# Patient Record
Sex: Male | Born: 2000 | Race: White | Hispanic: No | Marital: Single | Smoking: Never smoker
Health system: Southern US, Community
[De-identification: ages and names within clinical notes are randomized; demographics above are authoritative.]

## PROBLEM LIST (undated history)

## (undated) HISTORY — PX: TONSILLECTOMY: SUR1361

---
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
## 2021-01-01 DIAGNOSIS — Z419 Encounter for procedure for purposes other than remedying health state, unspecified: Secondary | ICD-10-CM | POA: Diagnosis not present

## 2021-01-21 DIAGNOSIS — S39012A Strain of muscle, fascia and tendon of lower back, initial encounter: Secondary | ICD-10-CM | POA: Diagnosis not present

## 2021-01-21 DIAGNOSIS — G44309 Post-traumatic headache, unspecified, not intractable: Secondary | ICD-10-CM | POA: Diagnosis not present

## 2021-01-21 DIAGNOSIS — S161XXA Strain of muscle, fascia and tendon at neck level, initial encounter: Secondary | ICD-10-CM | POA: Diagnosis not present

## 2021-01-29 DIAGNOSIS — Z419 Encounter for procedure for purposes other than remedying health state, unspecified: Secondary | ICD-10-CM | POA: Diagnosis not present

## 2021-02-18 ENCOUNTER — Emergency Department (HOSPITAL_COMMUNITY): Payer: Medicaid Other

## 2021-02-18 ENCOUNTER — Emergency Department: Payer: Medicaid Other

## 2021-02-18 ENCOUNTER — Other Ambulatory Visit: Payer: Self-pay

## 2021-02-18 ENCOUNTER — Emergency Department
Admission: EM | Admit: 2021-02-18 | Discharge: 2021-02-18 | Disposition: A | Discharge status: Home / Self Care | Payer: Medicaid Other | Attending: Emergency Medicine | Admitting: Emergency Medicine

## 2021-02-18 DIAGNOSIS — Y92410 Unspecified street and highway as the place of occurrence of the external cause: Secondary | ICD-10-CM | POA: Insufficient documentation

## 2021-02-18 DIAGNOSIS — S39012A Strain of muscle, fascia and tendon of lower back, initial encounter: Secondary | ICD-10-CM | POA: Insufficient documentation

## 2021-02-18 DIAGNOSIS — S161XXA Strain of muscle, fascia and tendon at neck level, initial encounter: Secondary | ICD-10-CM | POA: Diagnosis not present

## 2021-02-18 DIAGNOSIS — G44319 Acute post-traumatic headache, not intractable: Secondary | ICD-10-CM | POA: Diagnosis not present

## 2021-02-18 DIAGNOSIS — S199XXA Unspecified injury of neck, initial encounter: Secondary | ICD-10-CM | POA: Diagnosis not present

## 2021-02-18 DIAGNOSIS — R519 Headache, unspecified: Secondary | ICD-10-CM | POA: Diagnosis not present

## 2021-02-18 MED ORDER — MELOXICAM 15 MG PO TABS: 15.0000 mg | ORAL_TABLET | Freq: Every day | ORAL | 0 refills | Status: DC

## 2021-02-18 MED ORDER — MELOXICAM 7.5 MG PO TABS
15.0000 mg | ORAL_TABLET | Freq: Once | ORAL | Status: AC
  Administered 2021-02-18: 15 mg via ORAL
  Filled 2021-02-18: qty 2

## 2021-02-18 MED ORDER — METHOCARBAMOL 500 MG PO TABS: 500.0000 mg | ORAL_TABLET | Freq: Four times a day (QID) | ORAL | 0 refills | Status: DC
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
## 2021-02-18 NOTE — ED Notes (Signed)
Patient taken to CT.

## 2021-02-27 ENCOUNTER — Ambulatory Visit: Payer: Self-pay | Admitting: *Deleted
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
## 2021-03-01 DIAGNOSIS — Z419 Encounter for procedure for purposes other than remedying health state, unspecified: Secondary | ICD-10-CM | POA: Diagnosis not present

## 2021-03-31 DIAGNOSIS — Z419 Encounter for procedure for purposes other than remedying health state, unspecified: Secondary | ICD-10-CM | POA: Diagnosis not present

## 2021-05-01 DIAGNOSIS — Z419 Encounter for procedure for purposes other than remedying health state, unspecified: Secondary | ICD-10-CM | POA: Diagnosis not present

## 2021-05-31 DIAGNOSIS — Z419 Encounter for procedure for purposes other than remedying health state, unspecified: Secondary | ICD-10-CM | POA: Diagnosis not present

## 2021-08-01 DIAGNOSIS — Z419 Encounter for procedure for purposes other than remedying health state, unspecified: Secondary | ICD-10-CM | POA: Diagnosis not present
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
## 2021-08-31 DIAGNOSIS — Z419 Encounter for procedure for purposes other than remedying health state, unspecified: Secondary | ICD-10-CM | POA: Diagnosis not present

## 2021-10-01 DIAGNOSIS — Z419 Encounter for procedure for purposes other than remedying health state, unspecified: Secondary | ICD-10-CM | POA: Diagnosis not present

## 2021-10-31 DIAGNOSIS — Z419 Encounter for procedure for purposes other than remedying health state, unspecified: Secondary | ICD-10-CM | POA: Diagnosis not present

## 2021-12-01 DIAGNOSIS — Z419 Encounter for procedure for purposes other than remedying health state, unspecified: Secondary | ICD-10-CM | POA: Diagnosis not present

## 2022-01-01 DIAGNOSIS — Z419 Encounter for procedure for purposes other than remedying health state, unspecified: Secondary | ICD-10-CM | POA: Diagnosis not present

## 2022-01-29 DIAGNOSIS — Z419 Encounter for procedure for purposes other than remedying health state, unspecified: Secondary | ICD-10-CM | POA: Diagnosis not present

## 2022-03-01 DIAGNOSIS — Z419 Encounter for procedure for purposes other than remedying health state, unspecified: Secondary | ICD-10-CM | POA: Diagnosis not present

## 2022-03-31 DIAGNOSIS — Z419 Encounter for procedure for purposes other than remedying health state, unspecified: Secondary | ICD-10-CM | POA: Diagnosis not present

## 2022-05-01 DIAGNOSIS — Z419 Encounter for procedure for purposes other than remedying health state, unspecified: Secondary | ICD-10-CM | POA: Diagnosis not present

## 2022-05-31 DIAGNOSIS — Z419 Encounter for procedure for purposes other than remedying health state, unspecified: Secondary | ICD-10-CM | POA: Diagnosis not present

## 2022-07-01 DIAGNOSIS — Z419 Encounter for procedure for purposes other than remedying health state, unspecified: Secondary | ICD-10-CM | POA: Diagnosis not present

## 2022-08-01 DIAGNOSIS — Z419 Encounter for procedure for purposes other than remedying health state, unspecified: Secondary | ICD-10-CM | POA: Diagnosis not present

## 2022-08-31 DIAGNOSIS — Z419 Encounter for procedure for purposes other than remedying health state, unspecified: Secondary | ICD-10-CM | POA: Diagnosis not present

## 2022-10-01 DIAGNOSIS — Z419 Encounter for procedure for purposes other than remedying health state, unspecified: Secondary | ICD-10-CM | POA: Diagnosis not present

## 2022-10-31 DIAGNOSIS — Z419 Encounter for procedure for purposes other than remedying health state, unspecified: Secondary | ICD-10-CM | POA: Diagnosis not present

## 2022-12-01 DIAGNOSIS — Z419 Encounter for procedure for purposes other than remedying health state, unspecified: Secondary | ICD-10-CM | POA: Diagnosis not present

## 2023-01-01 DIAGNOSIS — Z419 Encounter for procedure for purposes other than remedying health state, unspecified: Secondary | ICD-10-CM | POA: Diagnosis not present

## 2023-01-30 DIAGNOSIS — Z419 Encounter for procedure for purposes other than remedying health state, unspecified: Secondary | ICD-10-CM | POA: Diagnosis not present

## 2023-03-02 DIAGNOSIS — Z419 Encounter for procedure for purposes other than remedying health state, unspecified: Secondary | ICD-10-CM | POA: Diagnosis not present

## 2023-04-01 DIAGNOSIS — Z419 Encounter for procedure for purposes other than remedying health state, unspecified: Secondary | ICD-10-CM | POA: Diagnosis not present

## 2023-04-07 NOTE — Progress Notes (Unsigned)
   There were no vitals taken for this visit.   Subjective:    Patient ID: Brendan Collier, male    DOB: 2001-02-05, 21 y.o.   MRN: [id number]  HPI: Brendan Collier is a 22 y.o. male  No chief complaint on file.  Establish care: his last physical was ***.  Medical history includes ***.  Family history includes ***.  Health Maintenance ***.   Relevant past medical, surgical, family and social history reviewed and updated as indicated. Interim medical history since our last visit reviewed. Allergies and medications reviewed and updated.  Review of Systems  Constitutional: Negative for fever or weight change.  Respiratory: Negative for cough and shortness of breath.   Cardiovascular: Negative for chest pain or palpitations.  Gastrointestinal: Negative for abdominal pain, no bowel changes.  Musculoskeletal: Negative for gait problem or joint swelling.  Skin: Negative for rash.  Neurological: Negative for dizziness or headache.  No other specific complaints in a complete review of systems (except as listed in HPI above).      Objective:    There were no vitals taken for this visit.  Wt Readings from Last 3 Encounters:  02/18/21 130 lb (59 kg) (15 %, Z= -1.06)*  02/26/19 125 lb 8 oz (56.9 kg) (22 %, Z= -0.78)*  05/27/17 119 lb 7.8 oz (54.2 kg) (38 %, Z= -0.29)*   * Growth percentiles are based on CDC (Boys, 2-20 Years) data.    Physical Exam  Constitutional: Patient appears well-developed and well-nourished. Obese *** No distress.  HEENT: head atraumatic, normocephalic, pupils equal and reactive to light, ears ***, neck supple, throat within normal limits Cardiovascular: Normal rate, regular rhythm and normal heart sounds.  No murmur heard. No BLE edema. Pulmonary/Chest: Effort normal and breath sounds normal. No respiratory distress. Abdominal: Soft.  There is no tenderness. Psychiatric: Patient has a normal mood and affect. behavior is normal. Judgment and thought content  normal.     Assessment & Plan:   Problem List Items Addressed This Visit   None    Follow up plan: No follow-ups on file.

## 2023-04-08 ENCOUNTER — Other Ambulatory Visit: Payer: Self-pay

## 2023-04-08 ENCOUNTER — Ambulatory Visit (INDEPENDENT_AMBULATORY_CARE_PROVIDER_SITE_OTHER): Payer: Medicaid Other | Admitting: Nurse Practitioner

## 2023-04-08 ENCOUNTER — Encounter: Payer: Self-pay | Admitting: Nurse Practitioner

## 2023-04-08 VITALS — BP 120/72 | HR 83 | Temp 98.1°F | Resp 18 | Ht 65.25 in | Wt 126.2 lb

## 2023-04-08 DIAGNOSIS — R5383 Other fatigue: Secondary | ICD-10-CM

## 2023-04-08 DIAGNOSIS — Z114 Encounter for screening for human immunodeficiency virus [HIV]: Secondary | ICD-10-CM | POA: Diagnosis not present

## 2023-04-08 DIAGNOSIS — Z130 Encounter for screening for diseases of the blood and blood-forming organs and certain disorders involving the immune mechanism: Secondary | ICD-10-CM

## 2023-04-08 DIAGNOSIS — Z131 Encounter for screening for diabetes mellitus: Secondary | ICD-10-CM | POA: Diagnosis not present

## 2023-04-08 DIAGNOSIS — Z13220 Encounter for screening for lipoid disorders: Secondary | ICD-10-CM | POA: Diagnosis not present

## 2023-04-08 DIAGNOSIS — Z7689 Persons encountering health services in other specified circumstances: Secondary | ICD-10-CM | POA: Diagnosis not present

## 2023-04-08 DIAGNOSIS — K219 Gastro-esophageal reflux disease without esophagitis: Secondary | ICD-10-CM

## 2023-04-08 DIAGNOSIS — Z1159 Encounter for screening for other viral diseases: Secondary | ICD-10-CM | POA: Diagnosis not present

## 2023-04-08 LAB — CBC WITH DIFFERENTIAL/PLATELET
Absolute Monocytes: 410 cells/uL (ref 200–950)
Basophils Absolute: 51 cells/uL (ref 0–200)
Basophils Relative: 0.8 %
Eosinophils Absolute: 122 cells/uL (ref 15–500)
Eosinophils Relative: 1.9 %
Hemoglobin: 14.8 g/dL (ref 13.2–17.1)
Lymphs Abs: 1376 cells/uL (ref 850–3900)
MPV: 11.4 fL (ref 7.5–12.5)
Neutro Abs: 4442 cells/uL (ref 1500–7800)
Neutrophils Relative %: 69.4 %
Platelets: 242 10*3/uL (ref 140–400)
RBC: 4.83 10*6/uL (ref 4.20–5.80)
Total Lymphocyte: 21.5 %
WBC: 6.4 10*3/uL (ref 3.8–10.8)

## 2023-04-08 NOTE — Assessment & Plan Note (Signed)
Patient reports after watching what he eats this has resolved. Not currently on medication

## 2023-04-09 LAB — COMPLETE METABOLIC PANEL WITH GFR
AG Ratio: 1.9 (calc) (ref 1.0–2.5)
ALT: 11 U/L (ref 9–46)
AST: 13 U/L (ref 10–40)
Albumin: 4.4 g/dL (ref 3.6–5.1)
Alkaline phosphatase (APISO): 48 U/L (ref 36–130)
BUN: 12 mg/dL (ref 7–25)
CO2: 28 mmol/L (ref 20–32)
Calcium: 9.3 mg/dL (ref 8.6–10.3)
Chloride: 103 mmol/L (ref 98–110)
Creat: 0.96 mg/dL (ref 0.60–1.24)
Globulin: 2.3 g/dL (calc) (ref 1.9–3.7)
Glucose, Bld: 142 mg/dL — ABNORMAL HIGH (ref 65–99)
Potassium: 4.2 mmol/L (ref 3.5–5.3)
Sodium: 141 mmol/L (ref 135–146)
Total Bilirubin: 0.9 mg/dL (ref 0.2–1.2)
Total Protein: 6.7 g/dL (ref 6.1–8.1)
eGFR: 115 mL/min/{1.73_m2} (ref >=60)

## 2023-04-09 LAB — LIPID PANEL
Cholesterol: 109 mg/dL (ref <200)
HDL: 49 mg/dL (ref >=40)
LDL Cholesterol (Calc): 47 mg/dL (calc)
Non-HDL Cholesterol (Calc): 60 mg/dL (calc) (ref <130)
Total CHOL/HDL Ratio: 2.2 (calc) (ref <5.0)
Triglycerides: 54 mg/dL (ref <150)

## 2023-04-09 LAB — HEMOGLOBIN A1C
Hgb A1c MFr Bld: 5.6 % of total Hgb (ref <5.7)
Mean Plasma Glucose: 114 mg/dL
eAG (mmol/L): 6.3 mmol/L

## 2023-04-09 LAB — CBC WITH DIFFERENTIAL/PLATELET
HCT: 43.7 % (ref 38.5–50.0)
MCH: 30.6 pg (ref 27.0–33.0)
MCHC: 33.9 g/dL (ref 32.0–36.0)
MCV: 90.5 fL (ref 80.0–100.0)
Monocytes Relative: 6.4 %
RDW: 11.9 % (ref 11.0–15.0)

## 2023-04-09 LAB — HEPATITIS C ANTIBODY: Hepatitis C Ab: NONREACTIVE

## 2023-04-09 LAB — HIV ANTIBODY (ROUTINE TESTING W REFLEX): HIV 1&2 Ab, 4th Generation: NONREACTIVE

## 2023-04-09 LAB — TSH: TSH: 0.53 mIU/L (ref 0.40–4.50)

## 2023-04-09 LAB — VITAMIN D 25 HYDROXY (VIT D DEFICIENCY, FRACTURES): Vit D, 25-Hydroxy: 29 ng/mL — ABNORMAL LOW (ref 30–100)

## 2023-05-02 DIAGNOSIS — Z419 Encounter for procedure for purposes other than remedying health state, unspecified: Secondary | ICD-10-CM | POA: Diagnosis not present

## 2023-06-01 DIAGNOSIS — Z419 Encounter for procedure for purposes other than remedying health state, unspecified: Secondary | ICD-10-CM | POA: Diagnosis not present

## 2023-07-02 DIAGNOSIS — Z419 Encounter for procedure for purposes other than remedying health state, unspecified: Secondary | ICD-10-CM | POA: Diagnosis not present

## 2023-08-02 DIAGNOSIS — Z419 Encounter for procedure for purposes other than remedying health state, unspecified: Secondary | ICD-10-CM | POA: Diagnosis not present

## 2023-09-01 DIAGNOSIS — Z419 Encounter for procedure for purposes other than remedying health state, unspecified: Secondary | ICD-10-CM | POA: Diagnosis not present

## 2023-10-12 NOTE — Progress Notes (Deleted)
Name: Brendan Collier   MRN: [id number]    DOB: June 02, 2001   Date:10/12/2023       Progress Note  Subjective  Chief Complaint  No chief complaint on file.   HPI  Patient presents for annual CPE ***.  IPSS Questionnaire (AUA-7): Over the past month.   1)  How often have you had a sensation of not emptying your bladder completely after you finish urinating?  {Rating:19227}  2)  How often have you had to urinate again less than two hours after you finished urinating? {Rating:19227}  3)  How often have you found you stopped and started again several times when you urinated?  {Rating:19227}  4) How difficult have you found it to postpone urination?  {Rating:19227}  5) How often have you had a weak urinary stream?  {Rating:19227}  6) How often have you had to push or strain to begin urination?  {Rating:19227}  7) How many times did you most typically get up to urinate from the time you went to bed until the time you got up in the morning?  {Rating:19228}  Total score:  0-7 mildly symptomatic   8-19 moderately symptomatic   20-35 severely symptomatic     Diet: *** Exercise: *** Last Dental Exam: **** Last Eye Exam: ***  Depression: phq 9 is {gen pos SAY:301601}    04/08/2023    9:30 AM  Depression screen PHQ 2/9  Decreased Interest 0  Down, Depressed, Hopeless 0  PHQ - 2 Score 0    Hypertension:  BP Readings from Last 3 Encounters:  04/08/23 120/72  02/18/21 (!) 110/56  02/26/19 95/65    Obesity: Wt Readings from Last 3 Encounters:  04/08/23 126 lb 3.2 oz (57.2 kg)  02/18/21 130 lb (59 kg) (15%, Z= -1.06)*  02/26/19 125 lb 8 oz (56.9 kg) (22%, Z= -0.78)*   * Growth percentiles are based on CDC (Boys, 2-20 Years) data.   BMI Readings from Last 3 Encounters:  04/08/23 20.84 kg/m  02/18/21 20.36 kg/m (21%, Z= -0.79)*   * Growth percentiles are based on CDC (Boys, 2-20 Years) data.     Lipids:  Lab Results  Component Value Date   CHOL 109 04/08/2023   Lab  Results  Component Value Date   HDL 49 04/08/2023   Lab Results  Component Value Date   LDLCALC 47 04/08/2023   Lab Results  Component Value Date   TRIG 54 04/08/2023   Lab Results  Component Value Date   CHOLHDL 2.2 04/08/2023   No results found for: "LDLDIRECT" Glucose:  Glucose, Bld  Date Value Ref Range Status  04/08/2023 142 (H) 65 - 99 mg/dL Final    Comment:    .            Fasting reference interval . For someone without known diabetes, a glucose value >125 mg/dL indicates that they may have diabetes and this should be confirmed with a follow-up test. .     Flowsheet Row Office Visit from 04/08/2023 in [facility]  AUDIT-C Score 0      ***  Single STD testing and prevention (HIV/chl/gon/syphilis):  {yes/no/default n/a:21102::"not applicable"} Sexual history:  Hep C Screening:  Skin cancer: Discussed monitoring for atypical lesions Colorectal cancer: *** Prostate cancer:  {yes/no/default n/a:21102::"not applicable"} No results found for: "PSA"   Lung cancer:  Low Dose CT Chest recommended if Age 41-80 years, 30 pack-year currently smoking OR have quit w/in 15years. Patient  {Response; is/is  not/na:63} a candidate for screening   AAA: The USPSTF recommends one-time screening with ultrasonography in men ages 62 to 75 years who have ever smoked. Patient   {Response; is /is not/na:63}, a candidate for screening  ECG:  ***  Vaccines:   HPV:  Tdap:  Shingrix:  Pneumonia:  Flu:  COVID-19:  Advanced Care Planning: A voluntary discussion about advance care planning including the explanation and discussion of advance directives.  Discussed health care proxy and Living will, and the patient was able to identify a health care proxy as ***.  Patient {DOES_DOES ONG:29528} have a living will and power of attorney of health care   Patient Active Problem List   Diagnosis Date Noted   Gastroesophageal reflux disease without esophagitis  04/08/2023    Past Surgical History:  Procedure Laterality Date   TONSILLECTOMY      No family history on file.  Social History   Socioeconomic History   Marital status: Single    Spouse name: Not on file   Number of children: Not on file   Years of education: Not on file   Highest education level: Not on file  Occupational History   Not on file  Tobacco Use   Smoking status: Never    Passive exposure: Never   Smokeless tobacco: Never  Vaping Use   Vaping status: Every Day   Substances: Nicotine, Flavoring  Substance and Sexual Activity   Alcohol use: No   Drug use: Yes    Types: Marijuana   Sexual activity: Not Currently  Other Topics Concern   Not on file  Social History Narrative   Work on boats for a living   Social Determinants of Health   Financial Resource Strain: Not on file  Food Insecurity: Not on file  Transportation Needs: Not on file  Physical Activity: Not on file  Stress: Not on file  Social Connections: Not on file  Intimate Partner Violence: Not on file    No current outpatient medications on file.  No Known Allergies   ROS  ***   Objective  There were no vitals filed for this visit.  There is no height or weight on file to calculate BMI.  Physical Exam ***  No results found for this or any previous visit (from the past 2160 hour(s)).   Fall Risk:    04/08/2023    9:30 AM  Fall Risk   Falls in the past year? 0  Number falls in past yr: 0  Injury with Fall? 0     Functional Status Survey:      Assessment & Plan  There are no diagnoses linked to this encounter.   -Prostate cancer screening and PSA options (with potential risks and benefits of testing vs not testing) were discussed along with recent recs/guidelines. -USPSTF grade A and B recommendations reviewed with patient; age-appropriate recommendations, preventive care, screening tests, etc discussed and encouraged; healthy living encouraged; see AVS for  patient education given to patient -Discussed importance of 150 minutes of physical activity weekly, eat two servings of fish weekly, eat one serving of tree nuts ( cashews, pistachios, pecans, almonds.Marland Kitchen) every other day, eat 6 servings of fruit/vegetables daily and drink plenty of water and avoid sweet beverages.  -Reviewed Health Maintenance: {yes/no/default n/a:21102::"not applicable"}

## 2023-10-14 ENCOUNTER — Encounter: Payer: Medicaid Other | Admitting: Nurse Practitioner
